# Patient Record
Sex: Female | Born: 1960 | Race: Black or African American | Hispanic: No | Smoking: Current every day smoker
Health system: Southern US, Community
[De-identification: ages and names within clinical notes are randomized; demographics above are authoritative.]

## PROBLEM LIST (undated history)

## (undated) DIAGNOSIS — I10 Essential (primary) hypertension: Secondary | ICD-10-CM

## (undated) DIAGNOSIS — C801 Malignant (primary) neoplasm, unspecified: Secondary | ICD-10-CM

## (undated) DIAGNOSIS — E7800 Pure hypercholesterolemia, unspecified: Secondary | ICD-10-CM

## (undated) HISTORY — PX: COLONOSCOPY: SHX174

---

## 2018-06-21 ENCOUNTER — Other Ambulatory Visit: Payer: Self-pay | Admitting: Neurological Surgery

## 2018-07-31 NOTE — Pre-Procedure Instructions (Signed)
Kathleen Villarreal  07/31/2018      [facility] [place], [place] - [place] [address] Phone: [phone] Fax: [phone]    Your procedure is scheduled on Monday December 16th.  Report to [facility] Admitting at 11:30 A.M.  Call this number if you have problems the morning of surgery:  [phone]   Remember:  Do not eat or drink after midnight.     Take these medicines the morning of surgery with A SIP OF WATER  amLODipine (NORVASC) gabapentin (NEURONTIN)-if needed  Hypromellose (ARTIFICIAL TEARS OP)-if needed  7 days prior to surgery STOP taking any Aspirin(unless otherwise instructed by your surgeon), Aleve, Naproxen, Ibuprofen, Motrin, Advil, Goody's, BC's, all herbal medications, fish oil, and all vitamins    Do not wear jewelry, make-up or nail polish.  Do not wear lotions, powders, or perfumes, or deodorant.  Do not shave 48 hours prior to surgery.    Do not bring valuables to the hospital.  [facility] is not responsible for any belongings or valuables.  Contacts, eyeglasses, hearing aids, dentures or bridgework may not be worn into surgery.  Leave your suitcase in the car.  After surgery it may be brought to your room.  For patients admitted to the hospital, discharge time will be determined by your treatment team.  Patients discharged the day of surgery will not be allowed to drive home.   [facility]- Preparing For Surgery  Before surgery, you can play an important role. Because skin is not sterile, your skin needs to be as free of germs as possible. You can reduce the number of germs on your skin by washing with CHG (chlorahexidine gluconate) Soap before surgery.  CHG is an antiseptic cleaner which kills germs and bonds with the skin to continue killing germs even after washing.    Oral Hygiene is also important to reduce your risk of infection.  Remember - BRUSH YOUR TEETH THE  MORNING OF SURGERY WITH YOUR REGULAR TOOTHPASTE  Please do not use if you have an allergy to CHG or antibacterial soaps. If your skin becomes reddened/irritated stop using the CHG.  Do not shave (including legs and underarms) for at least 48 hours prior to first CHG shower. It is OK to shave your face.  Please follow these instructions carefully.   1. Shower the NIGHT BEFORE SURGERY and the MORNING OF SURGERY with CHG.   2. If you chose to wash your hair, wash your hair first as usual with your normal shampoo.  3. After you shampoo, rinse your hair and body thoroughly to remove the shampoo.  4. Use CHG as you would any other liquid soap. You can apply CHG directly to the skin and wash gently with a scrungie or a clean washcloth.   5. Apply the CHG Soap to your body ONLY FROM THE NECK DOWN.  Do not use on open wounds or open sores. Avoid contact with your eyes, ears, mouth and genitals (private parts). Wash Face and genitals (private parts)  with your normal soap.  6. Wash thoroughly, paying special attention to the area where your surgery will be performed.  7. Thoroughly rinse your body with warm water from the neck down.  8. DO NOT shower/wash with your normal soap after using and rinsing off the CHG Soap.  9. Pat yourself dry with a CLEAN TOWEL.  10. Wear CLEAN PAJAMAS to bed the night before surgery, wear comfortable clothes  the morning of surgery  11. Place CLEAN SHEETS on your bed the night of your first shower and DO NOT SLEEP WITH PETS.    Day of Surgery: Shower as stated above. Do not apply any deodorants/lotions.  Please wear clean clothes to the hospital/surgery center.   Remember to brush your teeth WITH YOUR REGULAR TOOTHPASTE.

## 2018-08-01 ENCOUNTER — Encounter (HOSPITAL_COMMUNITY)
Admission: RE | Admit: 2018-08-01 | Discharge: 2018-08-01 | Disposition: A | Discharge status: Home / Self Care | Payer: Medicaid Other | Source: Ambulatory Visit | Attending: Neurological Surgery | Admitting: Neurological Surgery

## 2018-08-01 ENCOUNTER — Other Ambulatory Visit: Payer: Self-pay

## 2018-08-01 ENCOUNTER — Encounter (HOSPITAL_COMMUNITY): Payer: Self-pay

## 2018-08-01 DIAGNOSIS — Z01818 Encounter for other preprocedural examination: Secondary | ICD-10-CM | POA: Insufficient documentation

## 2018-08-01 HISTORY — DX: Pure hypercholesterolemia, unspecified: E78.00

## 2018-08-01 HISTORY — DX: Malignant (primary) neoplasm, unspecified: C80.1

## 2018-08-01 HISTORY — DX: Essential (primary) hypertension: I10

## 2018-08-01 LAB — CBC
HEMATOCRIT: 48.1 % — AB (ref 36.0–46.0)
Hemoglobin: 15.2 g/dL — ABNORMAL HIGH (ref 12.0–15.0)
MCH: 28.4 pg (ref 26.0–34.0)
MCHC: 31.6 g/dL (ref 30.0–36.0)
MCV: 89.9 fL (ref 80.0–100.0)
PLATELETS: 173 10*3/uL (ref 150–400)
RBC: 5.35 MIL/uL — ABNORMAL HIGH (ref 3.87–5.11)
RDW: 13.7 % (ref 11.5–15.5)
WBC: 8.6 10*3/uL (ref 4.0–10.5)
nRBC: 0 % (ref 0.0–0.2)

## 2018-08-01 LAB — BASIC METABOLIC PANEL
Anion gap: 11 (ref 5–15)
BUN: 16 mg/dL (ref 6–20)
CALCIUM: 9.6 mg/dL (ref 8.9–10.3)
CHLORIDE: 105 mmol/L (ref 98–111)
CO2: 23 mmol/L (ref 22–32)
CREATININE: 0.75 mg/dL (ref 0.44–1.00)
GFR calc Af Amer: >60 mL/min (ref >60)
GFR calc non Af Amer: >60 mL/min (ref >60)
Glucose, Bld: 85 mg/dL (ref 70–99)
Potassium: 4.4 mmol/L (ref 3.5–5.1)
Sodium: 139 mmol/L (ref 135–145)

## 2018-08-01 LAB — TYPE AND SCREEN
ABO/RH(D): A POS
Antibody Screen: NEGATIVE

## 2018-08-01 LAB — SURGICAL PCR SCREEN
MRSA, PCR: NEGATIVE
Staphylococcus aureus: NEGATIVE

## 2018-08-01 LAB — ABO/RH: ABO/RH(D): A POS

## 2018-08-01 NOTE — Progress Notes (Signed)
PCP - Oralia Rud, FNP-C  Cardiologist - denies  Chest x-ray - N/A EKG - 08/01/18 Stress Test - denies ECHO - denies Cardiac Cath - denies  Sleep Study - denies  Anesthesia review: No  Patient denies shortness of breath, fever, cough and chest pain at PAT appointment   Patient verbalized understanding of instructions that were given to them at the PAT appointment. Patient was also instructed that they will need to review over the PAT instructions again at home before surgery.

## 2018-08-07 ENCOUNTER — Inpatient Hospital Stay (HOSPITAL_COMMUNITY): Payer: Medicaid Other | Admitting: Anesthesiology

## 2018-08-07 ENCOUNTER — Inpatient Hospital Stay (HOSPITAL_COMMUNITY)
Admission: RE | Disposition: A | Discharge status: Home / Self Care | Payer: Self-pay | Source: Home / Self Care | Attending: Neurological Surgery

## 2018-08-07 ENCOUNTER — Inpatient Hospital Stay (HOSPITAL_COMMUNITY)
Admission: RE | Admit: 2018-08-07 | Discharge: 2018-08-11 | DRG: 454 | Disposition: A | Discharge status: Home / Self Care | Payer: Medicaid Other | Attending: Neurological Surgery | Admitting: Neurological Surgery

## 2018-08-07 ENCOUNTER — Inpatient Hospital Stay (HOSPITAL_COMMUNITY): Payer: Medicaid Other

## 2018-08-07 ENCOUNTER — Encounter (HOSPITAL_COMMUNITY): Payer: Self-pay | Admitting: Certified Registered Nurse Anesthetist

## 2018-08-07 DIAGNOSIS — Z8572 Personal history of non-Hodgkin lymphomas: Secondary | ICD-10-CM | POA: Diagnosis not present

## 2018-08-07 DIAGNOSIS — Z713 Dietary counseling and surveillance: Secondary | ICD-10-CM | POA: Diagnosis not present

## 2018-08-07 DIAGNOSIS — Z91041 Radiographic dye allergy status: Secondary | ICD-10-CM | POA: Diagnosis not present

## 2018-08-07 DIAGNOSIS — Z419 Encounter for procedure for purposes other than remedying health state, unspecified: Secondary | ICD-10-CM

## 2018-08-07 DIAGNOSIS — M4316 Spondylolisthesis, lumbar region: Principal | ICD-10-CM | POA: Diagnosis present

## 2018-08-07 DIAGNOSIS — I10 Essential (primary) hypertension: Secondary | ICD-10-CM | POA: Diagnosis present

## 2018-08-07 DIAGNOSIS — Z79899 Other long term (current) drug therapy: Secondary | ICD-10-CM

## 2018-08-07 DIAGNOSIS — M5416 Radiculopathy, lumbar region: Secondary | ICD-10-CM | POA: Diagnosis present

## 2018-08-07 DIAGNOSIS — M48062 Spinal stenosis, lumbar region with neurogenic claudication: Secondary | ICD-10-CM | POA: Diagnosis present

## 2018-08-07 DIAGNOSIS — Z880 Allergy status to penicillin: Secondary | ICD-10-CM

## 2018-08-07 DIAGNOSIS — F17210 Nicotine dependence, cigarettes, uncomplicated: Secondary | ICD-10-CM | POA: Diagnosis present

## 2018-08-07 DIAGNOSIS — D62 Acute posthemorrhagic anemia: Secondary | ICD-10-CM | POA: Diagnosis not present

## 2018-08-07 DIAGNOSIS — Z923 Personal history of irradiation: Secondary | ICD-10-CM

## 2018-08-07 SURGERY — POSTERIOR LUMBAR FUSION 1 LEVEL
Anesthesia: General | Site: Back

## 2018-08-07 MED ORDER — BUPIVACAINE HCL (PF) 0.5 % IJ SOLN
INTRAMUSCULAR | Status: DC | PRN
  Administered 2018-08-07: 10 mL

## 2018-08-07 MED ORDER — ROCURONIUM BROMIDE 50 MG/5ML IV SOSY
PREFILLED_SYRINGE | INTRAVENOUS | Status: AC
  Filled 2018-08-07: qty 5

## 2018-08-07 MED ORDER — SODIUM CHLORIDE 0.9 % IV SOLN: 250.0000 mL | INTRAVENOUS | Status: DC

## 2018-08-07 MED ORDER — BISACODYL 10 MG RE SUPP: 10.0000 mg | Freq: Every day | RECTAL | Status: DC | PRN

## 2018-08-07 MED ORDER — FLEET ENEMA 7-19 GM/118ML RE ENEM
1.0000 | ENEMA | Freq: Once | RECTAL | Status: AC | PRN
  Administered 2018-08-10: 1 via RECTAL
  Filled 2018-08-07: qty 1

## 2018-08-07 MED ORDER — KETOROLAC TROMETHAMINE 15 MG/ML IJ SOLN
15.0000 mg | Freq: Four times a day (QID) | INTRAMUSCULAR | Status: AC
  Administered 2018-08-07 – 2018-08-08 (×3): 15 mg via INTRAVENOUS
  Filled 2018-08-07 (×3): qty 1

## 2018-08-07 MED ORDER — METHOCARBAMOL 1000 MG/10ML IJ SOLN
500.0000 mg | Freq: Four times a day (QID) | INTRAVENOUS | Status: DC | PRN
  Filled 2018-08-07: qty 5

## 2018-08-07 MED ORDER — ALUM & MAG HYDROXIDE-SIMETH 200-200-20 MG/5ML PO SUSP: 30.0000 mL | Freq: Four times a day (QID) | ORAL | Status: DC | PRN

## 2018-08-07 MED ORDER — DEXAMETHASONE SODIUM PHOSPHATE 10 MG/ML IJ SOLN
INTRAMUSCULAR | Status: DC | PRN
  Administered 2018-08-07: 10 mg via INTRAVENOUS

## 2018-08-07 MED ORDER — DIPHENHYDRAMINE HCL 50 MG/ML IJ SOLN
INTRAMUSCULAR | Status: DC | PRN
  Administered 2018-08-07: 12.5 mg via INTRAVENOUS

## 2018-08-07 MED ORDER — PHENYLEPHRINE 40 MCG/ML (10ML) SYRINGE FOR IV PUSH (FOR BLOOD PRESSURE SUPPORT)
PREFILLED_SYRINGE | INTRAVENOUS | Status: DC | PRN
  Administered 2018-08-07: 80 ug via INTRAVENOUS

## 2018-08-07 MED ORDER — SODIUM CHLORIDE 0.9 % IV SOLN
INTRAVENOUS | Status: DC | PRN
  Administered 2018-08-07: 25 ug/min via INTRAVENOUS

## 2018-08-07 MED ORDER — ONDANSETRON HCL 4 MG/2ML IJ SOLN: 4.0000 mg | Freq: Four times a day (QID) | INTRAMUSCULAR | Status: DC | PRN

## 2018-08-07 MED ORDER — LISINOPRIL 20 MG PO TABS
40.0000 mg | ORAL_TABLET | Freq: Every day | ORAL | Status: DC
  Administered 2018-08-08 – 2018-08-11 (×2): 40 mg via ORAL
  Filled 2018-08-07 (×4): qty 2

## 2018-08-07 MED ORDER — ONDANSETRON HCL 4 MG/2ML IJ SOLN
INTRAMUSCULAR | Status: DC | PRN
  Administered 2018-08-07: 4 mg via INTRAVENOUS

## 2018-08-07 MED ORDER — ONDANSETRON HCL 4 MG PO TABS: 4.0000 mg | ORAL_TABLET | Freq: Four times a day (QID) | ORAL | Status: DC | PRN

## 2018-08-07 MED ORDER — OXYCODONE-ACETAMINOPHEN 5-325 MG PO TABS
1.0000 | ORAL_TABLET | ORAL | Status: DC | PRN
  Administered 2018-08-07 – 2018-08-11 (×19): 2 via ORAL
  Filled 2018-08-07 (×18): qty 2

## 2018-08-07 MED ORDER — ALBUMIN HUMAN 5 % IV SOLN
INTRAVENOUS | Status: DC | PRN
  Administered 2018-08-07: 17:00:00 via INTRAVENOUS

## 2018-08-07 MED ORDER — BUPIVACAINE HCL (PF) 0.5 % IJ SOLN
INTRAMUSCULAR | Status: AC
  Filled 2018-08-07: qty 30

## 2018-08-07 MED ORDER — LIDOCAINE 2% (20 MG/ML) 5 ML SYRINGE
INTRAMUSCULAR | Status: DC | PRN
  Administered 2018-08-07: 100 mg via INTRAVENOUS

## 2018-08-07 MED ORDER — LACTATED RINGERS IV SOLN: INTRAVENOUS | Status: DC

## 2018-08-07 MED ORDER — THROMBIN 20000 UNITS EX SOLR
CUTANEOUS | Status: DC | PRN
  Administered 2018-08-07: 16:00:00 via TOPICAL

## 2018-08-07 MED ORDER — ACETAMINOPHEN 325 MG PO TABS
650.0000 mg | ORAL_TABLET | ORAL | Status: DC | PRN
  Administered 2018-08-10: 650 mg via ORAL
  Filled 2018-08-07: qty 2

## 2018-08-07 MED ORDER — SUGAMMADEX SODIUM 200 MG/2ML IV SOLN
INTRAVENOUS | Status: DC | PRN
  Administered 2018-08-07: 200 mg via INTRAVENOUS

## 2018-08-07 MED ORDER — SODIUM CHLORIDE 0.9% FLUSH: 3.0000 mL | INTRAVENOUS | Status: DC | PRN

## 2018-08-07 MED ORDER — 0.9 % SODIUM CHLORIDE (POUR BTL) OPTIME
TOPICAL | Status: DC | PRN
  Administered 2018-08-07: 1000 mL

## 2018-08-07 MED ORDER — VANCOMYCIN HCL IN DEXTROSE 1-5 GM/200ML-% IV SOLN
1000.0000 mg | INTRAVENOUS | Status: AC
  Administered 2018-08-07: 1000 mg via INTRAVENOUS

## 2018-08-07 MED ORDER — HYPROMELLOSE (GONIOSCOPIC) 2.5 % OP SOLN
2.0000 [drp] | Freq: Every day | OPHTHALMIC | Status: DC | PRN
  Filled 2018-08-07: qty 15

## 2018-08-07 MED ORDER — DIPHENHYDRAMINE HCL 50 MG/ML IJ SOLN
INTRAMUSCULAR | Status: AC
  Filled 2018-08-07: qty 1

## 2018-08-07 MED ORDER — DOCUSATE SODIUM 100 MG PO CAPS
100.0000 mg | ORAL_CAPSULE | Freq: Two times a day (BID) | ORAL | Status: DC
  Administered 2018-08-07 – 2018-08-11 (×8): 100 mg via ORAL
  Filled 2018-08-07 (×8): qty 1

## 2018-08-07 MED ORDER — ACETAMINOPHEN 650 MG RE SUPP: 650.0000 mg | RECTAL | Status: DC | PRN

## 2018-08-07 MED ORDER — DEXAMETHASONE SODIUM PHOSPHATE 10 MG/ML IJ SOLN
INTRAMUSCULAR | Status: AC
  Filled 2018-08-07: qty 1

## 2018-08-07 MED ORDER — HYDROMORPHONE HCL 1 MG/ML IJ SOLN
INTRAMUSCULAR | Status: AC
  Administered 2018-08-07: 0.5 mg via INTRAVENOUS
  Filled 2018-08-07: qty 1

## 2018-08-07 MED ORDER — MORPHINE SULFATE (PF) 2 MG/ML IV SOLN
2.0000 mg | INTRAVENOUS | Status: DC | PRN
  Administered 2018-08-07 – 2018-08-09 (×2): 2 mg via INTRAVENOUS
  Filled 2018-08-07 (×3): qty 1

## 2018-08-07 MED ORDER — VANCOMYCIN HCL IN DEXTROSE 1-5 GM/200ML-% IV SOLN
INTRAVENOUS | Status: AC
  Filled 2018-08-07: qty 200

## 2018-08-07 MED ORDER — THROMBIN (RECOMBINANT) 20000 UNITS EX SOLR
CUTANEOUS | Status: AC
  Filled 2018-08-07: qty 20000

## 2018-08-07 MED ORDER — FENTANYL CITRATE (PF) 250 MCG/5ML IJ SOLN
INTRAMUSCULAR | Status: AC
  Filled 2018-08-07: qty 5

## 2018-08-07 MED ORDER — SODIUM CHLORIDE 0.9% FLUSH
3.0000 mL | Freq: Two times a day (BID) | INTRAVENOUS | Status: DC
  Administered 2018-08-08 – 2018-08-11 (×5): 3 mL via INTRAVENOUS

## 2018-08-07 MED ORDER — PHENOL 1.4 % MT LIQD: 1.0000 | OROMUCOSAL | Status: DC | PRN

## 2018-08-07 MED ORDER — THROMBIN 5000 UNITS EX SOLR
CUTANEOUS | Status: AC
  Filled 2018-08-07: qty 5000

## 2018-08-07 MED ORDER — OXYCODONE-ACETAMINOPHEN 5-325 MG PO TABS
ORAL_TABLET | ORAL | Status: AC
  Administered 2018-08-09: 2 via ORAL
  Filled 2018-08-07: qty 2

## 2018-08-07 MED ORDER — FENTANYL CITRATE (PF) 250 MCG/5ML IJ SOLN
INTRAMUSCULAR | Status: DC | PRN
  Administered 2018-08-07 (×2): 50 ug via INTRAVENOUS
  Administered 2018-08-07: 150 ug via INTRAVENOUS
  Administered 2018-08-07 (×5): 50 ug via INTRAVENOUS

## 2018-08-07 MED ORDER — MIDAZOLAM HCL 2 MG/2ML IJ SOLN
INTRAMUSCULAR | Status: DC | PRN
  Administered 2018-08-07: 2 mg via INTRAVENOUS

## 2018-08-07 MED ORDER — LACTATED RINGERS IV SOLN
INTRAVENOUS | Status: DC
  Administered 2018-08-07 (×2): via INTRAVENOUS

## 2018-08-07 MED ORDER — MIDAZOLAM HCL 2 MG/2ML IJ SOLN
INTRAMUSCULAR | Status: AC
  Filled 2018-08-07: qty 2

## 2018-08-07 MED ORDER — SENNA 8.6 MG PO TABS
1.0000 | ORAL_TABLET | Freq: Two times a day (BID) | ORAL | Status: DC
  Administered 2018-08-07 – 2018-08-11 (×8): 8.6 mg via ORAL
  Filled 2018-08-07 (×8): qty 1

## 2018-08-07 MED ORDER — AMLODIPINE BESYLATE 5 MG PO TABS
5.0000 mg | ORAL_TABLET | Freq: Every day | ORAL | Status: DC
  Administered 2018-08-08 – 2018-08-11 (×2): 5 mg via ORAL
  Filled 2018-08-07 (×4): qty 1

## 2018-08-07 MED ORDER — LIDOCAINE-EPINEPHRINE 1 %-1:100000 IJ SOLN
INTRAMUSCULAR | Status: AC
  Filled 2018-08-07: qty 1

## 2018-08-07 MED ORDER — SODIUM CHLORIDE 0.9 % IV SOLN
INTRAVENOUS | Status: DC | PRN
  Administered 2018-08-07: 16:00:00

## 2018-08-07 MED ORDER — CHLORHEXIDINE GLUCONATE CLOTH 2 % EX PADS: 6.0000 | MEDICATED_PAD | Freq: Once | CUTANEOUS | Status: DC

## 2018-08-07 MED ORDER — ONDANSETRON HCL 4 MG/2ML IJ SOLN
INTRAMUSCULAR | Status: AC
  Filled 2018-08-07: qty 2

## 2018-08-07 MED ORDER — ROCURONIUM BROMIDE 10 MG/ML (PF) SYRINGE
PREFILLED_SYRINGE | INTRAVENOUS | Status: DC | PRN
  Administered 2018-08-07 (×2): 50 mg via INTRAVENOUS
  Administered 2018-08-07: 30 mg via INTRAVENOUS

## 2018-08-07 MED ORDER — HYDROMORPHONE HCL 1 MG/ML IJ SOLN
0.2500 mg | INTRAMUSCULAR | Status: DC | PRN
  Administered 2018-08-07 (×2): 0.5 mg via INTRAVENOUS

## 2018-08-07 MED ORDER — GABAPENTIN 600 MG PO TABS
1200.0000 mg | ORAL_TABLET | Freq: Two times a day (BID) | ORAL | Status: DC | PRN
  Administered 2018-08-10: 1200 mg via ORAL
  Filled 2018-08-07: qty 2

## 2018-08-07 MED ORDER — MENTHOL 3 MG MT LOZG: 1.0000 | LOZENGE | OROMUCOSAL | Status: DC | PRN

## 2018-08-07 MED ORDER — LIDOCAINE 2% (20 MG/ML) 5 ML SYRINGE
INTRAMUSCULAR | Status: AC
  Filled 2018-08-07: qty 5

## 2018-08-07 MED ORDER — METHOCARBAMOL 500 MG PO TABS
ORAL_TABLET | ORAL | Status: AC
  Filled 2018-08-07: qty 1

## 2018-08-07 MED ORDER — SIMVASTATIN 20 MG PO TABS
40.0000 mg | ORAL_TABLET | Freq: Every evening | ORAL | Status: DC
  Administered 2018-08-08 – 2018-08-09 (×2): 40 mg via ORAL
  Filled 2018-08-07 (×2): qty 2

## 2018-08-07 MED ORDER — METHOCARBAMOL 500 MG PO TABS
500.0000 mg | ORAL_TABLET | Freq: Four times a day (QID) | ORAL | Status: DC | PRN
  Administered 2018-08-07 – 2018-08-11 (×6): 500 mg via ORAL
  Filled 2018-08-07 (×5): qty 1

## 2018-08-07 MED ORDER — PROPOFOL 10 MG/ML IV BOLUS
INTRAVENOUS | Status: DC | PRN
  Administered 2018-08-07: 170 mg via INTRAVENOUS

## 2018-08-07 MED ORDER — LIDOCAINE-EPINEPHRINE 1 %-1:100000 IJ SOLN
INTRAMUSCULAR | Status: DC | PRN
  Administered 2018-08-07: 10 mL

## 2018-08-07 MED ORDER — THROMBIN 5000 UNITS EX SOLR
OROMUCOSAL | Status: DC | PRN
  Administered 2018-08-07 (×2): via TOPICAL

## 2018-08-07 MED ORDER — PHENYLEPHRINE 40 MCG/ML (10ML) SYRINGE FOR IV PUSH (FOR BLOOD PRESSURE SUPPORT)
PREFILLED_SYRINGE | INTRAVENOUS | Status: AC
  Filled 2018-08-07: qty 10

## 2018-08-07 MED ORDER — POLYETHYLENE GLYCOL 3350 17 G PO PACK
17.0000 g | PACK | Freq: Every day | ORAL | Status: DC | PRN
  Administered 2018-08-10: 17 g via ORAL
  Filled 2018-08-07: qty 1

## 2018-08-07 SURGICAL SUPPLY — 63 items
BAG DECANTER FOR FLEXI CONT (MISCELLANEOUS) ×2 IMPLANT
BASKET BONE COLLECTION (BASKET) ×2 IMPLANT
BLADE CLIPPER SURG (BLADE) IMPLANT
BONE CANC CHIPS 20CC PCAN1/4 (Bone Implant) ×2 IMPLANT
BUR MATCHSTICK NEURO 3.0 LAGG (BURR) ×2 IMPLANT
CANISTER SUCT 3000ML PPV (MISCELLANEOUS) ×2 IMPLANT
CHIPS CANC BONE 20CC PCAN1/4 (Bone Implant) ×1 IMPLANT
CONT SPEC 4OZ CLIKSEAL STRL BL (MISCELLANEOUS) ×2 IMPLANT
COVER BACK TABLE 60X90IN (DRAPES) ×2 IMPLANT
COVER WAND RF STERILE (DRAPES) ×2 IMPLANT
DECANTER SPIKE VIAL GLASS SM (MISCELLANEOUS) ×2 IMPLANT
DERMABOND ADVANCED (GAUZE/BANDAGES/DRESSINGS) ×1
DERMABOND ADVANCED .7 DNX12 (GAUZE/BANDAGES/DRESSINGS) ×1 IMPLANT
DEVICE DISSECT PLASMABLAD 3.0S (MISCELLANEOUS) ×1 IMPLANT
DRAPE C-ARM 42X72 X-RAY (DRAPES) ×4 IMPLANT
DRAPE C-ARMOR (DRAPES) ×2 IMPLANT
DRAPE HALF SHEET 40X57 (DRAPES) IMPLANT
DRAPE LAPAROTOMY 100X72X124 (DRAPES) ×2 IMPLANT
DURAPREP 26ML APPLICATOR (WOUND CARE) ×2 IMPLANT
DURASEAL APPLICATOR TIP (TIP) IMPLANT
DURASEAL SPINE SEALANT 3ML (MISCELLANEOUS) IMPLANT
ELECT REM PT RETURN 9FT ADLT (ELECTROSURGICAL) ×2
ELECTRODE REM PT RTRN 9FT ADLT (ELECTROSURGICAL) ×1 IMPLANT
GAUZE 4X4 16PLY RFD (DISPOSABLE) ×2 IMPLANT
GAUZE SPONGE 4X4 12PLY STRL (GAUZE/BANDAGES/DRESSINGS) ×2 IMPLANT
GLOVE BIO SURGEON STRL SZ8 (GLOVE) ×2 IMPLANT
GLOVE BIOGEL PI IND STRL 8.5 (GLOVE) ×2 IMPLANT
GLOVE BIOGEL PI INDICATOR 8.5 (GLOVE) ×2
GLOVE ECLIPSE 8.5 STRL (GLOVE) ×4 IMPLANT
GLOVE INDICATOR 8.5 STRL (GLOVE) ×2 IMPLANT
GLOVE SURG SS PI 7.5 STRL IVOR (GLOVE) ×2 IMPLANT
GOWN STRL REUS W/ TWL LRG LVL3 (GOWN DISPOSABLE) IMPLANT
GOWN STRL REUS W/ TWL XL LVL3 (GOWN DISPOSABLE) IMPLANT
GOWN STRL REUS W/TWL 2XL LVL3 (GOWN DISPOSABLE) ×4 IMPLANT
GOWN STRL REUS W/TWL LRG LVL3 (GOWN DISPOSABLE)
GOWN STRL REUS W/TWL XL LVL3 (GOWN DISPOSABLE)
HEMOSTAT POWDER KIT SURGIFOAM (HEMOSTASIS) ×2 IMPLANT
KIT BASIN OR (CUSTOM PROCEDURE TRAY) ×2 IMPLANT
KIT TURNOVER KIT B (KITS) ×2 IMPLANT
MILL MEDIUM DISP (BLADE) ×2 IMPLANT
NEEDLE HYPO 22GX1.5 SAFETY (NEEDLE) ×2 IMPLANT
NS IRRIG 1000ML POUR BTL (IV SOLUTION) ×2 IMPLANT
PACK LAMINECTOMY NEURO (CUSTOM PROCEDURE TRAY) ×2 IMPLANT
PAD ARMBOARD 7.5X6 YLW CONV (MISCELLANEOUS) ×6 IMPLANT
PATTIES SURGICAL .5 X1 (DISPOSABLE) ×2 IMPLANT
PLASMABLADE 3.0S (MISCELLANEOUS) ×2
ROD TI ALLOY CVD VIT 5.5X35MM (Rod) ×4 IMPLANT
SCREW POLY TL 6.5X45 (Screw) ×8 IMPLANT
SPACER LORD STRT 8X20X10X8 (Spacer) ×4 IMPLANT
SPONGE LAP 4X18 RFD (DISPOSABLE) IMPLANT
SPONGE SURGIFOAM ABS GEL 100 (HEMOSTASIS) ×2 IMPLANT
SUT PROLENE 6 0 BV (SUTURE) IMPLANT
SUT VIC AB 1 CT1 18XBRD ANBCTR (SUTURE) ×1 IMPLANT
SUT VIC AB 1 CT1 8-18 (SUTURE) ×1
SUT VIC AB 2-0 CP2 18 (SUTURE) ×4 IMPLANT
SUT VIC AB 3-0 SH 8-18 (SUTURE) ×4 IMPLANT
SYR 3ML LL SCALE MARK (SYRINGE) ×8 IMPLANT
SYR CONTROL 10ML LL (SYRINGE) ×2 IMPLANT
TOP CLOSURE TORQ LIMIT (Neuro Prosthesis/Implant) ×8 IMPLANT
TOWEL GREEN STERILE (TOWEL DISPOSABLE) ×2 IMPLANT
TOWEL GREEN STERILE FF (TOWEL DISPOSABLE) ×2 IMPLANT
TRAY FOLEY MTR SLVR 16FR STAT (SET/KITS/TRAYS/PACK) ×2 IMPLANT
WATER STERILE IRR 1000ML POUR (IV SOLUTION) ×2 IMPLANT

## 2018-08-07 NOTE — Anesthesia Preprocedure Evaluation (Addendum)
Anesthesia Evaluation  Patient identified by MRN, date of birth, ID band  Reviewed: Allergy & Precautions, H&P , NPO status , Patient's Chart, lab work & pertinent test results  Airway Mallampati: II  TM Distance: >3 FB Neck ROM: Full    Dental no notable dental hx. (+) Partial Upper, Dental Advisory Given   Pulmonary Current Smoker,    Pulmonary exam normal breath sounds clear to auscultation       Cardiovascular hypertension, Pt. on medications  Rhythm:Regular Rate:Normal     Neuro/Psych negative neurological ROS  negative psych ROS   GI/Hepatic negative GI ROS, Neg liver ROS,   Endo/Other  negative endocrine ROS  Renal/GU negative Renal ROS  negative genitourinary   Musculoskeletal   Abdominal   Peds  Hematology negative hematology ROS (+)   Anesthesia Other Findings   Reproductive/Obstetrics negative OB ROS                            Anesthesia Physical Anesthesia Plan  ASA: II  Anesthesia Plan: General   Post-op Pain Management:    Induction: Intravenous  PONV Risk Score and Plan: 3 and Ondansetron, Dexamethasone and Midazolam  Airway Management Planned: Oral ETT  Additional Equipment:   Intra-op Plan:   Post-operative Plan: Extubation in OR  Informed Consent: I have reviewed the patients History and Physical, chart, labs and discussed the procedure including the risks, benefits and alternatives for the proposed anesthesia with the patient or authorized representative who has indicated his/her understanding and acceptance.   Dental advisory given  Plan Discussed with: CRNA  Anesthesia Plan Comments:         Anesthesia Quick Evaluation

## 2018-08-07 NOTE — Anesthesia Procedure Notes (Signed)
Procedure Name: Intubation Date/Time: 08/07/2018 2:40 PM Performed by: Alain Marion, CRNA Pre-anesthesia Checklist: Patient identified, Emergency Drugs available, Suction available and Patient being monitored Patient Re-evaluated:Patient Re-evaluated prior to induction Oxygen Delivery Method: Circle System Utilized Preoxygenation: Pre-oxygenation with 100% oxygen Induction Type: IV induction Ventilation: Mask ventilation without difficulty Laryngoscope Size: Miller and 2 Grade View: Grade I Tube type: Oral Number of attempts: 1 Airway Equipment and Method: Stylet and Oral airway Placement Confirmation: ETT inserted through vocal cords under direct vision,  positive ETCO2 and breath sounds checked- equal and bilateral Secured at: 21 cm Tube secured with: Tape Dental Injury: Teeth and Oropharynx as per pre-operative assessment

## 2018-08-07 NOTE — Op Note (Signed)
Date of surgery: 08/07/2018 Preoperative diagnosis: Lumbar spondylolisthesis L4-L5 with lumbar radiculopathy, neurogenic claudication. Postoperative diagnosis: Same Procedure: Bilateral laminectomies L4-L5 decompression of the L4 and L5 nerve roots with more work than required for simple interbody technique.  Posterior lumbar interbody arthrodesis using peek spacers local autograft and allograft L4-L5 pedicle screw fixation L4-L5 with posterior lateral arthrodesis using local autograft and allograft. Surgeon: Kristeen Miss First Assistant: Kary Kos MD Anesthesia: General endotracheal Indications: Allergy and Beatris Si is a 57 year old individual who is had significant back and bilateral lower extremity pain and weakness.  She has evidence of a spondylolisthesis with high-grade stenosis worse on the left than on the right for the L4-L5 level.  She is advised regarding the need for surgery to decompress the common dural tube and nerve roots and to stabilize her spine at L4-L5.  Procedure: Patient was brought to the operating room supine on a stretcher.  After the smooth induction of general tracheal anesthesia she was turned prone.  Back was prepped with alcohol and DuraPrep and draped in a sterile fashion.  Midline incision was created carried down to the lumbodorsal fascia which was opened on either side of midline for spinous processes identified with out of L4 and L5 with a radiograph.  Subperiosteal dissection was carried out to expose L4 and L5 out to the facet joints and over to the transverse processes.  These areas were isolated.  Then a large laminotomies were created removing the inferior margin lamina of L4 out to and including the entirety of the facet at L4-L5.  Thickened redundant yellow ligament with calcification within it was also removed and this allowed good decompression of the common dural tube and the L5 nerve roots inferiorly laminotomy and foraminotomy was created over the L5 nerve root.   Superiorly the L4 nerve root was cleared by dissecting away the thickened redundant yellow ligament out into the region of the foramen.  The disc was noted to be rolled over particularly on the left side and this was carefully taken down and the L4 nerve root was identified protected and decompressed.  Once completely decompressed the disc spaces were entered more fully and complete discectomy was carried out at L4-L5.  A series of sizers were then used to expand the interspace from initially a 6 mm to an 8 mm size.  Ultimately an 8 mm tall 8 degree lordotic Zai Stein peek spacer was filled with autograft and allograft and placed into the interspace along with 6 cc of autograft and allograft after the endplates were completely decorticated.  With the interspace being packed attention was turned to pedicle entry sites and lateral gutters were well decompressed and decorticated for packing with the remainder of the autograft and allograft 20 cc of allograft bone was mixed with the autograft that was harvested from the laminotomies.  6.5 x 45 mm pedicle screws were then placed in L4 and L5 sequentially with fluoroscopic guidance.  The lateral gutters were then packed with the remaining bone which totaled about 6 cc in each lateral gutter.  30 mm precontoured rods were used to connect the screws together.  This was done in the neutral construct.  And then final radiographs identified good distraction of the L4-L5 interspace the L4 and L5 nerve roots could each be palpated to make sure that they were manually decompressed hemostasis in the soft tissues was obtained meticulously and then the lumbodorsal fascia was reapproximated with #1 Vicryl interrupted fashion 2-0 Vicryl was used in subcutaneous tissues 3-0  Vicryl subcuticularly Dermabond was used on the skin blood loss is estimated 500 cc total.  No Cell Saver blood was recovered on the surgery Cell Saver was not used.

## 2018-08-07 NOTE — H&P (Signed)
CHIEF COMPLAINT: Pain in the spine and weakness in the legs for a year.  HISTORY OF PRESENT ILLNESS: Kathleen Villarreal is a 57 year old right-handed individual who tells me that, rather abruptly about a year ago, she started to develop pain on the left side of her back that radiated into primarily her left lower extremity. Over time, it has gotten considerably worse and she notes that she gets no relief of the pain despite having had treatment over the past year's time with 2 injections and some conservative visits to her primary care physician.  She initially had a CT scan that demonstrated that she had a spondylolisthesis in the lumbar spine and, subsequently, she underwent the injections.  They did not help whatsoever.  She notes that she then had an MRI of the lumbar spine performed at [facility] in [place] and this was done on July 24 of this year.  This demonstrates that she has a severe stenosis at L4-L5 with a grade 2 anterolisthesis of L4 on L5.  She has advanced degenerative changes at L5-S1, but the alignment of her spine and the patency of the central canal and lateral recesses is intact.  She has what appears to be a disc protrusion eccentric to the right side at L4-L5 along with the spondylolisthesis that accentuates the lateral recess stenosis.  Clinically, she feels that her legs are weak and, particularly, she feels that her left leg is going to give way when she walks any distance.  She has been dealing with the pain, but is referred now for further evaluation of this process.  PAST MEDICAL HISTORY: Reveals that she has high blood pressure and she is a cancer survivor of a lymphoma.  She notes that the lymphoma diagnosis with some 20 years ago and started as a little speck under her eye and was found to be lymphoma and required radiation treatments.  She has been cancer free since that time.  SOCIAL HISTORY: Other personal history reveals that she smokes about 4 cigarettes a day and she has done so  for a number of years.  CURRENT MEDICATIONS: Include Lisinopril, Simvastatin and Gabapentin twice a day that she uses for pain.  ALLERGIES: Penicillin and dye, which cause her to break out in hives.  SYSTEMS REVIEW: Notable for some night sweats, infections, high cholesterol, leg pain while walking, difficulty starting urinary stream, arthritis, leg weakness, back pain, anxiety and depression on a 14-point review sheet noted in the office today.  PHYSICAL EXAMINATION: I note that she does stand straight and erect.  She walks with a moderate antalgia involving the left lower extremity primarily, and she has weakness in the iliopsoas and quad on the right leg at 4/5 and some mild weakness in the quad on the left side at 4/5.  She has weakness in both tibialis anterior groups, graded at 4- out of 5 and she has difficulty walking onto her heels.  She can walk onto her toes with moderate ease.  She has absent deep tendon reflexes in the Achilles and the patellae, both.  Patrick maneuver is negative bilaterally.  IMPRESSION: The patient has advanced spondylolisthesis grade 2 at L4-L5 with severe stenosis and some moderate weakness in the distal lower extremities.  I have advised Kathleen Villarreal that, ultimately, she requires surgical intervention for this process.  This would require a laminectomy done posteriorly, followed by decompression of the L4-L5 disc space, placing some spacers to reduce the spondylolisthesis and pedicle screws to stabilize L4 and L5.  This is  a substantial undertaking, but is what is required in order to stabilize this process. Though she is not keen on surgery, she notes that she feels the need to do something definitive, as she feels her legs are becoming increasingly weaker and her tolerance to activity on her feet has been reduced substantially.  I strongly urged smoking cessation for her and she notes that this has been on her plan for a long period of time, but now would certainly be  a time to enact that.  I answered her questions regarding the nature of the surgery and the planned recovery, which typically requires about 8 weeks use of an external corset.  Most patients require some narcotic analgesia for several weeks postop, but generally are weaned away from that in a month's time and the fact that most patients can resume more normal activities after about 6-8 weeks from the surgery.  We will plan on scheduling the surgery at her convenience.

## 2018-08-07 NOTE — Transfer of Care (Signed)
Immediate Anesthesia Transfer of Care Note  Patient: Kathleen Villarreal  Procedure(s) Performed: Lumbar Four-Five Posterior lumbar interbody fusion (N/A Back)  Patient Location: PACU  Anesthesia Type:General  Level of Consciousness: awake  Airway & Oxygen Therapy: Patient Spontanous Breathing and Patient connected to face mask oxygen  Post-op Assessment: Report given to RN, Post -op Vital signs reviewed and stable and Patient moving all extremities X 4  Post vital signs: Reviewed and stable  Last Vitals:  Vitals Value Taken Time  BP 116/96 08/07/2018  6:02 PM  Temp 36.3 C 08/07/2018  6:01 PM  Pulse 63 08/07/2018  6:04 PM  Resp 27 08/07/2018  6:04 PM  SpO2 100 % 08/07/2018  6:04 PM  Vitals shown include unvalidated device data.  Last Pain:  Vitals:   08/07/18 1801  TempSrc:   PainSc: 7          Complications: No apparent anesthesia complications

## 2018-08-07 NOTE — Progress Notes (Signed)
Patient ID: Kathleen Villarreal, female   DOB: 02-Jul-1961, 57 y.o.   MRN: [id number] Vital signs are stable Appears to be moving legs well Appears reasonably comfortable postop

## 2018-08-08 ENCOUNTER — Other Ambulatory Visit: Payer: Self-pay

## 2018-08-08 LAB — CBC
HCT: 34.9 % — ABNORMAL LOW (ref 36.0–46.0)
Hemoglobin: 11 g/dL — ABNORMAL LOW (ref 12.0–15.0)
MCH: 28.3 pg (ref 26.0–34.0)
MCHC: 31.5 g/dL (ref 30.0–36.0)
MCV: 89.7 fL (ref 80.0–100.0)
PLATELETS: 141 10*3/uL — AB (ref 150–400)
RBC: 3.89 MIL/uL (ref 3.87–5.11)
RDW: 13.3 % (ref 11.5–15.5)
WBC: 12.1 10*3/uL — ABNORMAL HIGH (ref 4.0–10.5)
nRBC: 0 % (ref 0.0–0.2)

## 2018-08-08 LAB — BASIC METABOLIC PANEL
ANION GAP: 9 (ref 5–15)
BUN: 10 mg/dL (ref 6–20)
CALCIUM: 8.7 mg/dL — AB (ref 8.9–10.3)
CO2: 24 mmol/L (ref 22–32)
Chloride: 105 mmol/L (ref 98–111)
Creatinine, Ser: 0.7 mg/dL (ref 0.44–1.00)
GFR calc Af Amer: >60 mL/min (ref >60)
GLUCOSE: 138 mg/dL — AB (ref 70–99)
Potassium: 4 mmol/L (ref 3.5–5.1)
Sodium: 138 mmol/L (ref 135–145)

## 2018-08-08 MED FILL — Thrombin For Soln 5000 Unit: CUTANEOUS | Qty: 5000 | Status: AC

## 2018-08-08 MED FILL — Gelatin Absorbable MT Powder: OROMUCOSAL | Qty: 1 | Status: AC

## 2018-08-08 MED FILL — Thrombin (Recombinant) For Soln 20000 Unit: CUTANEOUS | Qty: 1 | Status: AC

## 2018-08-08 NOTE — Evaluation (Signed)
Physical Therapy Evaluation Patient Details Name: Kathleen Villarreal MRN: [id number] DOB: 1961/07/11 Today's Date: 08/08/2018   History of Present Illness  Pt is a 57yo female admitted for L4-L5 decompression. Pt with 1 yr ho back pain that has failed conservative treatment.  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   Clinical Impression  Pt admitted with/for elective lumbar fusion surgery. Pt presently needs min guard to supervision for basic mobility and gait.  Pt currently limited functionally due to the problems listed below.  (see problems list.)  Pt will benefit from PT to maximize function and safety to be able to get home safely with available assist.     Follow Up Recommendations Home health PT    Equipment Recommendations  None recommended by PT    Recommendations for Other Services       Precautions / Restrictions Precautions Precautions: Back Precaution Booklet Issued: Yes (comment) Precaution Comments: needs precautions reviewed. Required Braces or Orthoses: Spinal Brace Spinal Brace: Lumbar corset;Applied in sitting position Restrictions Weight Bearing Restrictions: No      Mobility  Bed Mobility Overal bed mobility: Needs Assistance Bed Mobility: Rolling;Sidelying to Sit Rolling: Supervision Sidelying to sit: Supervision       General bed mobility comments: cues given for log rolling and for hand placement. Bed was flat and pt did not use bedrails.  Transfers Overall transfer level: Needs assistance   Transfers: Sit to/from Stand;Stand Pivot Transfers Sit to Stand: Supervision Stand pivot transfers: Supervision       General transfer comment: Cues given for hand placement.  Ambulation/Gait Ambulation/Gait assistance: Supervision Gait Distance (Feet): 300 Feet Assistive device: Straight cane Gait Pattern/deviations: Step-through pattern   Gait velocity interpretation: >2.62 ft/sec, indicative of community ambulatory General Gait Details: safe use of the  SPC, generally steady and able to significantly increase speed to cue.  Stairs            Wheelchair Mobility    Modified Rankin (Stroke Patients Only)       Balance Overall balance assessment: Needs assistance Sitting-balance support: No upper extremity supported;Feet supported Sitting balance-Leahy Scale: Good     Standing balance support: During functional activity;Single extremity supported Standing balance-Leahy Scale: Fair Standing balance comment: Pt has used a cane for sometime now. Pt was steadier with cane.                             Pertinent Vitals/Pain Pain Assessment: Faces Pain Score: 6  Faces Pain Scale: Hurts little more Pain Location: back Pain Descriptors / Indicators: Aching;Operative site guarding Pain Intervention(s): Monitored during session;Repositioned;Premedicated before session    [place] expects to be discharged to:: Private residence Living Arrangements: Alone Available Help at Discharge: Family;Available PRN/intermittently Type of Home: House Home Access: Stairs to enter Entrance Stairs-Rails: None Entrance Stairs-Number of Steps: 1 Home Layout: One level Home Equipment: Cane - single point;Shower seat Additional Comments: Pt lives alone in a second story apartment she is moving out of.  Until then (Jan) she will stay with her son.  Her son works so she will be home alone at times but has somes support.    Prior Function Level of Independence: Independent with assistive device(s)         Comments: Walks with cane outside all the time and most times inside. Pt drives.     Hand Dominance   Dominant Hand: Right    Extremity/Trunk Assessment   Upper Extremity Assessment  Upper Extremity Assessment: Overall WFL for tasks assessed    Lower Extremity Assessment Lower Extremity Assessment: Overall WFL for tasks assessed    Cervical / Trunk Assessment Cervical / Trunk Assessment: Other  exceptions Cervical / Trunk Exceptions: current lumbar surgery  Communication   Communication: No difficulties  Cognition Arousal/Alertness: Awake/alert Behavior During Therapy: WFL for tasks assessed/performed Overall Cognitive Status: Within Functional Limits for tasks assessed                                        General Comments General comments (skin integrity, edema, etc.): Reinforced back care/prec, lifting restrictions, donning brace, progression of activity, demonstrated transition to/from sidelying.    Exercises     Assessment/Plan    PT Assessment Patient needs continued PT services  PT Problem List Decreased strength;Decreased activity tolerance;Decreased mobility;Decreased knowledge of use of DME;Decreased knowledge of precautions;Pain       PT Treatment Interventions Gait training;Stair training;Functional mobility training;Therapeutic activities;Patient/family education;DME instruction    PT Goals (Current goals can be found in the Care Plan section)  Acute Rehab PT Goals Patient Stated Goal: to not have so much pain. PT Goal Formulation: With patient Time For Goal Achievement: 08/15/18 Potential to Achieve Goals: Good    Frequency Min 5X/week   Barriers to discharge        Co-evaluation               AM-PAC PT "6 Clicks" Mobility  Outcome Measure Help needed turning from your back to your side while in a flat bed without using bedrails?: None Help needed moving from lying on your back to sitting on the side of a flat bed without using bedrails?: None Help needed moving to and from a bed to a chair (including a wheelchair)?: None Help needed standing up from a chair using your arms (e.g., wheelchair or bedside chair)?: None Help needed to walk in hospital room?: None Help needed climbing 3-5 steps with a railing? : A Little 6 Click Score: 23    End of Session Equipment Utilized During Treatment: Back brace Activity Tolerance:  Patient tolerated treatment well Patient left: in chair;with call bell/phone within reach Nurse Communication: Mobility status PT Visit Diagnosis: Other abnormalities of gait and mobility (R26.89);Pain Pain - part of body: (back)    Time: 4128-7867 PT Time Calculation (min) (ACUTE ONLY): 18 min   Charges:   PT Evaluation $PT Eval Low Complexity: 1 Low          08/08/2018  [name], PT Acute Rehabilitation Services [phone]  (pager) [phone]  (office)  Tessie Fass Maelys Kinnick 08/08/2018, 12:42 PM

## 2018-08-08 NOTE — Progress Notes (Signed)
2030 Pt arrived to unit from PACU. Pt A&Ox4, able to get up from stretcher and walk to bed. Surgical incision CDI, pt c/o pain. PACU RN stated patient was "just medicated" before arrival to unit. Patient oriented to room, assessed, skin checked with Lannette Donath, RN and vital signs taken. Pt asking for "food". RN provided patient snack, reviewed orders with patient and family at bedside. RN instructed use of incentive spirometer and patient performed correctly. Fall precautions in place, [facility]

## 2018-08-08 NOTE — Progress Notes (Signed)
Patient ID: Kathleen Villarreal, female   DOB: 08-26-1960, 57 y.o.   MRN: [id number] Vital signs are stable Patient has acute blood loss anemia with postoperative hemoglobin of 11.1 down from 15 She will not require transfusion as her vital signs remained stable She is ambulatory and feeling somewhat better We will continue to mobilize and see how independent she becomes in the next day or 2

## 2018-08-08 NOTE — Anesthesia Postprocedure Evaluation (Signed)
Anesthesia Post Note  Patient: Kathleen Villarreal  Procedure(s) Performed: Lumbar Four-Five Posterior lumbar interbody fusion (N/A Back)     Patient location during evaluation: PACU Anesthesia Type: General Level of consciousness: awake and alert Pain management: pain level controlled Vital Signs Assessment: post-procedure vital signs reviewed and stable Respiratory status: spontaneous breathing, nonlabored ventilation and respiratory function stable Cardiovascular status: blood pressure returned to baseline and stable Postop Assessment: no apparent nausea or vomiting Anesthetic complications: no    Last Vitals:  Vitals:   08/08/18 0239 08/08/18 0421  BP: 126/66 (!) 116/49  Pulse: 61 65  Resp: 18 18  Temp: 36.4 C 36.7 C  SpO2:  96%    Last Pain:  Vitals:   08/08/18 0551  TempSrc:   PainSc: 7                  Giuliana Handyside,W. EDMOND

## 2018-08-08 NOTE — Evaluation (Signed)
Occupational Therapy Evaluation Patient Details Name: Kathleen Villarreal MRN: [id number] DOB: Jun 09, 1961 Today's Date: 08/08/2018    History of Present Illness Pt is a 57yo female admitted for L4-L5 decompression. Pt with 1 yr ho back pain that has failed conservative treatment.  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    Clinical Impression   Pt admitted with the above diagnosis and has the deficits listed below. Pt would benefit from cont OT to increase independence with basic adls and adl transfers so she can eventually d/c home and live alone again.  Pt lives in second story apartment in which she is moving out of in January. Pt is all packed up so she will be living with her son for month of December to recover and then will move.  Pt feels she may need a HH Aid when she first gets to sons home as he works and will be gone part of the day. Pt is doing well and overall is min a to supervision with most adls and would not need an aid for long.  Reach and long sponge provided to improve independence with adls.  Since pt is overall supervision with occasional min guard for balance, no HHOT recommended at this time.      Follow Up Recommendations  Supervision - Intermittent;Other (comment)(HH Aid for short time while she is at sons and son at work?)    Equipment Recommendations  3 in 1 bedside commode    Recommendations for Other Services       Precautions / Restrictions Precautions Precautions: Back Precaution Booklet Issued: Yes (comment) Precaution Comments: needs precautions reviewed. Required Braces or Orthoses: Spinal Brace Spinal Brace: Lumbar corset;Applied in sitting position Restrictions Weight Bearing Restrictions: No      Mobility Bed Mobility Overal bed mobility: Needs Assistance Bed Mobility: Rolling;Sidelying to Sit Rolling: Supervision Sidelying to sit: Supervision       General bed mobility comments: cues given for log rolling and for hand placement. Bed was flat and  pt did not use bedrails.  Transfers Overall transfer level: Needs assistance   Transfers: Sit to/from Stand;Stand Pivot Transfers Sit to Stand: Min guard Stand pivot transfers: Supervision       General transfer comment: Cues given for hand placement.    Balance Overall balance assessment: Needs assistance Sitting-balance support: No upper extremity supported;Feet supported Sitting balance-Leahy Scale: Good     Standing balance support: During functional activity;Single extremity supported Standing balance-Leahy Scale: Fair Standing balance comment: Pt has used a cane for sometime now. Pt was steadier with cane.                           ADL either performed or assessed with clinical judgement   ADL Overall ADL's : Needs assistance/impaired Eating/Feeding: Independent;Sitting   Grooming: Wash/dry hands;Wash/dry face;Supervision/safety;Standing;Cueing for compensatory techniques Grooming Details (indicate cue type and reason): cues given to spit into cup and not drink by putting head under faucet to follow proper back precautions. Upper Body Bathing: Set up;Sitting   Lower Body Bathing: Minimal assistance;Sit to/from stand;Cueing for compensatory techniques Lower Body Bathing Details (indicate cue type and reason): cues to cross legs up if able to bathe feet. Pt can do this when legs are dry to dress but when legs are wet it is more difficult.  Pt provided with long handled sponge for bathing to easily reach LEs. Upper Body Dressing : Set up;Sitting Upper Body Dressing Details (indicate cue type and  reason): cues for brace since first time donning. Lower Body Dressing: Minimal assistance;Sit to/from stand;Cueing for compensatory techniques Lower Body Dressing Details (indicate cue type and reason): cues to cross legs to donn socks and shoes. Pt crossed legs easily. Toilet Transfer: Min guard;Comfort height toilet;Grab TEFL teacher Details (indicate cue type  and reason): Pt walked to bathroom with cane. Cues for hand placement. Toileting- Clothing Manipulation and Hygiene: Supervision/safety;Sit to/from stand;Cueing for compensatory techniques Toileting - Clothing Manipulation Details (indicate cue type and reason): cues provided to avoid twisting.   Tub/Shower Transfer Details (indicate cue type and reason): discussed shower transfer with use of 3:1. Pt has  shower chair but feets 3:1 is more sturdy and needs one for over her toilet. She will bathe only when son is home to start. Functional mobility during ADLs: Min guard;Cane General ADL Comments: Pt doing well with adls. Adaptive equipment (reacher) provided to assist with following back precautions.     Vision Baseline Vision/History: No visual deficits Patient Visual Report: No change from baseline Vision Assessment?: No apparent visual deficits     Perception Perception Perception Tested?: No   Praxis Praxis Praxis tested?: Not tested    Pertinent Vitals/Pain Pain Assessment: 0-10 Pain Score: 6  Pain Location: back Pain Descriptors / Indicators: Aching;Operative site guarding Pain Intervention(s): Limited activity within patient's tolerance;Premedicated before session;Monitored during session;Repositioned     Hand Dominance Right   Extremity/Trunk Assessment Upper Extremity Assessment Upper Extremity Assessment: Overall WFL for tasks assessed   Lower Extremity Assessment Lower Extremity Assessment: Defer to PT evaluation   Cervical / Trunk Assessment Cervical / Trunk Assessment: Other exceptions Cervical / Trunk Exceptions: current lumbar surgery   Communication Communication Communication: No difficulties   Cognition Arousal/Alertness: Awake/alert Behavior During Therapy: WFL for tasks assessed/performed Overall Cognitive Status: Within Functional Limits for tasks assessed                                     General Comments  Incision was closed and  not covered.     Exercises     Shoulder Instructions      Home Living Family/patient expects to be discharged to:: Private residence Living Arrangements: Alone Available Help at Discharge: Family;Available PRN/intermittently Type of Home: House Home Access: Stairs to enter [employer] of Steps: 1 Entrance Stairs-Rails: None Home Layout: One level     Bathroom Shower/Tub: Tub/shower unit;Curtain   Biochemist, clinical: Standard     Home Equipment: Cane - single point;Shower seat   Additional Comments: Pt lives alone in a second story apartment she is moving out of.  Until then (Jan) she will stay with her son.  Her son works so she will be home alone at times but has somes support.      Prior Functioning/Environment Level of Independence: Independent with assistive device(s)        Comments: Walks with cane outside all the time and most times inside. Pt drives.        OT Problem List: Impaired balance (sitting and/or standing);Decreased knowledge of use of DME or AE;Decreased knowledge of precautions;Pain      OT Treatment/Interventions: Self-care/ADL training;DME and/or AE instruction;Therapeutic activities    OT Goals(Current goals can be found in the care plan section) Acute Rehab OT Goals Patient Stated Goal: to not have so much pain. OT Goal Formulation: With patient Time For Goal Achievement: 08/22/18 Potential to Achieve Goals: Good ADL  Goals Pt Will Perform Tub/Shower Transfer: Tub transfer;with supervision;3 in 1;ambulating Additional ADL Goal #1: Pt will walk to bathroom and complete all toileting activites with supervision and use of cane.  OT Frequency: Min 2X/week   Barriers to D/C: Decreased caregiver support  will stay with son but he does work       Co-evaluation              AM-PAC OT "6 Clicks" Daily Activity     Outcome Measure Help from another person eating meals?: None Help from another person taking care of personal  grooming?: None Help from another person toileting, which includes using toliet, bedpan, or urinal?: A Little Help from another person bathing (including washing, rinsing, drying)?: A Little Help from another person to put on and taking off regular upper body clothing?: None Help from another person to put on and taking off regular lower body clothing?: A Little 6 Click Score: 21   End of Session Equipment Utilized During Treatment: Back brace;Other (comment)(cane) Nurse Communication: Mobility status  Activity Tolerance: Patient tolerated treatment well Patient left: in chair;with call bell/phone within reach  OT Visit Diagnosis: Unsteadiness on feet (R26.81);History of falling (Z91.81);Pain Pain - part of body: (back)                Time: 0950-1030 OT Time Calculation (min): 40 min Charges:  OT General Charges $OT Visit: 1 Visit OT Evaluation $OT Eval Moderate Complexity: 1 Mod OT Treatments $Self Care/Home Management : 8-22 mins  Jinger Neighbors, OTR/L [phone]  Glenford Peers 08/08/2018, 10:45 AM

## 2018-08-08 NOTE — Progress Notes (Signed)
Orthopedic Tech Progress Note Patient Details:  Kathleen Villarreal 13-Apr-1961 [id number]  Patient ID: Brown Human, female   DOB: 10-Feb-1961, 57 y.o.   MRN: [id number] RN says pt already has brace.  [name] 08/08/2018, 9:29 AM

## 2018-08-09 MED ORDER — MAGNESIUM HYDROXIDE 400 MG/5ML PO SUSP
30.0000 mL | Freq: Every day | ORAL | Status: DC | PRN
  Administered 2018-08-09 (×2): 30 mL via ORAL
  Filled 2018-08-09 (×2): qty 30

## 2018-08-09 NOTE — Progress Notes (Signed)
Patient ID: Kathleen Villarreal, female   DOB: [birth year], 57 y.o.   MRN: [id number] Vital signs are stable Patient notes that back hurts more today than it did yesterday She is voiding well but has not passed any gas We will use some milk of magnesia today and possibly suppository if necessary Motor function appears to be doing well otherwise Stable

## 2018-08-09 NOTE — Progress Notes (Signed)
Occupational Therapy Treatment Patient Details Name: Kathleen Villarreal MRN: [id number] DOB: 08/11/1961 Today's Date: 08/09/2018    History of present illness Pt is a 57yo female admitted for L4-L5 decompression. Pt with 1 yr ho back pain that has failed conservative treatment.  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    OT comments  Pt is at adequate level for d/c from OT stand point at this time. Pt with handouts for 3n1 bathroom setup and reacher/sponge issued to the patient on evaluation. Pt without questions at this time.   Follow Up Recommendations  Supervision - Intermittent    Equipment Recommendations  3 in 1 bedside commode    Recommendations for Other Services      Precautions / Restrictions Precautions Precautions: Back Required Braces or Orthoses: Spinal Brace Spinal Brace: Lumbar corset;Applied in sitting position       Mobility Bed Mobility Overal bed mobility: Modified Independent             General bed mobility comments: exiting on the right side  Transfers Overall transfer level: Modified independent               General transfer comment: pt alternating the hand use for the cane    Balance                                           ADL either performed or assessed with clinical judgement   ADL Overall ADL's : Needs assistance/impaired                 Upper Body Dressing : Modified independent;Sitting Upper Body Dressing Details (indicate cue type and reason): able to don brace. pt needed incr time to attached the pull strings     Toilet Transfer: Modified Independent       Tub/ Shower Transfer: Supervision/safety;3 in 1 Tub/Shower Transfer Details (indicate cue type and reason): hand out provided and education complete. pt will have son help her place 3n1 Functional mobility during ADLs: Supervision/safety General ADL Comments: communication to the RN was made on the patients behalf. the patient was upset about the  time between call bell used and the arrival of staff to help with the bathroom     Vision       Perception     Praxis      Cognition Arousal/Alertness: Awake/alert Behavior During Therapy: [facility] for tasks assessed/performed Overall Cognitive Status: Within Functional Limits for tasks assessed                                          Exercises     Shoulder Instructions       General Comments      Pertinent Vitals/ Pain          Home Living                                          Prior Functioning/Environment              Frequency  Min 2X/week        Progress Toward Goals  OT Goals(current goals can now be found in the care plan section)  Progress towards  OT goals: Progressing toward goals  Acute Rehab OT Goals Patient Stated Goal: to find out why it takes so long to get some help OT Goal Formulation: With patient Time For Goal Achievement: 08/22/18 Potential to Achieve Goals: Good ADL Goals Pt Will Perform Tub/Shower Transfer: Tub transfer;with supervision;3 in 1;ambulating Additional ADL Goal #1: Pt will walk to bathroom and complete all toileting activites with supervision and use of cane.  Plan Discharge plan remains appropriate    Co-evaluation                 AM-PAC OT "6 Clicks" Daily Activity     Outcome Measure   Help from another person eating meals?: None Help from another person taking care of personal grooming?: None Help from another person toileting, which includes using toliet, bedpan, or urinal?: A Little Help from another person bathing (including washing, rinsing, drying)?: A Little Help from another person to put on and taking off regular upper body clothing?: None Help from another person to put on and taking off regular lower body clothing?: A Little 6 Click Score: 21    End of Session Equipment Utilized During Treatment: Back brace;Other (comment)(cane)  OT Visit Diagnosis:  Unsteadiness on feet (R26.81);History of falling (Z91.81);Pain   Activity Tolerance Patient tolerated treatment well   Patient Left in chair;with call bell/phone within reach   Nurse Communication Mobility status;Precautions        Time: 1358(1358)-1417 OT Time Calculation (min): 19 min  Charges: OT General Charges $OT Visit: 1 Visit OT Treatments $Self Care/Home Management : 8-22 mins   Jeri Modena, OTR/L  Acute Rehabilitation Services Pager: [phone] Office: [phone] .    Jeri Modena 08/09/2018, 3:34 PM

## 2018-08-09 NOTE — Progress Notes (Signed)
Physical Therapy Treatment Patient Details Name: Kathleen Villarreal MRN: [id number] DOB: 03/07/1961 Today's Date: 08/09/2018    History of Present Illness Pt is a 57yo female admitted for L4-L5 decompression. Pt with 1 yr ho back pain that has failed conservative treatment.  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     PT Comments    Has demonstrated understanding of back safety and safe transitions.  Steady gait with the SPC.   Follow Up Recommendations  Home health PT     Equipment Recommendations  None recommended by PT    Recommendations for Other Services       Precautions / Restrictions Precautions Precautions: Back Precaution Booklet Issued: Yes (comment) Precaution Comments: needs precautions reviewed. Required Braces or Orthoses: Spinal Brace Spinal Brace: Lumbar corset;Applied in sitting position Restrictions Weight Bearing Restrictions: No    Mobility  Bed Mobility Overal bed mobility: Needs Assistance Bed Mobility: Rolling;Sidelying to Sit;Sit to Sidelying Rolling: Supervision Sidelying to sit: Supervision     Sit to sidelying: Supervision General bed mobility comments: practiced safe technique x3 without rail or assist  Transfers Overall transfer level: Needs assistance   Transfers: Sit to/from Stand;Stand Pivot Transfers Sit to Stand: Supervision Stand pivot transfers: Supervision       General transfer comment: safe hand placement  Ambulation/Gait Ambulation/Gait assistance: Supervision Gait Distance (Feet): 300 Feet Assistive device: Straight cane Gait Pattern/deviations: Step-through pattern   Gait velocity interpretation: >2.62 ft/sec, indicative of community ambulatory General Gait Details: safe, steady and able to change speeds significantly   Stairs Stairs: Yes Stairs assistance: Modified independent (Device/Increase time) Stair Management: One rail Right;Step to pattern;Forwards Number of Stairs: 12 General stair comments: safe with  rail   Wheelchair Mobility    Modified Rankin (Stroke Patients Only)       Balance     Sitting balance-Leahy Scale: Good       Standing balance-Leahy Scale: Good Standing balance comment: prefers the cane, but steady with static and some dynamic tasks                            Cognition Arousal/Alertness: Awake/alert Behavior During Therapy: WFL for tasks assessed/performed Overall Cognitive Status: Within Functional Limits for tasks assessed                                        Exercises      General Comments General comments (skin integrity, edema, etc.): Reinforced all back care.       Pertinent Vitals/Pain Pain Assessment: 0-10 Pain Score: 8  Pain Location: back Pain Descriptors / Indicators: Aching;Operative site guarding Pain Intervention(s): Monitored during session    Home Living                      Prior Function            PT Goals (current goals can now be found in the care plan section) Acute Rehab PT Goals Patient Stated Goal: to not have so much pain. PT Goal Formulation: With patient Time For Goal Achievement: 08/15/18 Potential to Achieve Goals: Good Progress towards PT goals: Progressing toward goals    Frequency    Min 5X/week      PT Plan Current plan remains appropriate    Co-evaluation              AM-PAC  PT "6 Clicks" Mobility   Outcome Measure  Help needed turning from your back to your side while in a flat bed without using bedrails?: None Help needed moving from lying on your back to sitting on the side of a flat bed without using bedrails?: None Help needed moving to and from a bed to a chair (including a wheelchair)?: None Help needed standing up from a chair using your arms (e.g., wheelchair or bedside chair)?: None Help needed to walk in hospital room?: None Help needed climbing 3-5 steps with a railing? : A Little 6 Click Score: 23    End of Session Equipment  Utilized During Treatment: Back brace Activity Tolerance: Patient tolerated treatment well Patient left: with call bell/phone within reach;in bed Nurse Communication: Mobility status PT Visit Diagnosis: Other abnormalities of gait and mobility (R26.89);Pain Pain - part of body: (back)     Time: 3846-6599 PT Time Calculation (min) (ACUTE ONLY): 24 min  Charges:  $Gait Training: 8-22 mins $Therapeutic Activity: 8-22 mins                     08/09/2018  [name], PT Acute Rehabilitation Services [phone]  (pager) [phone]  (office)   Tessie Fass Flavius Repsher 08/09/2018, 10:43 AM

## 2018-08-10 NOTE — Progress Notes (Signed)
Physical Therapy Treatment Patient Details Name: Kathleen Villarreal MRN: [id number] DOB: 1960/11/27 Today's Date: 08/10/2018    History of Present Illness Pt is a 57yo female admitted for L4-L5 decompression. Pt with 1 yr ho back pain that has failed conservative treatment.  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     PT Comments    Improving daily.  Emphasis on bed mobility technique, gait stability and speed.    Follow Up Recommendations  Home health PT     Equipment Recommendations  Other (comment)(Quad cane)    Recommendations for Other Services       Precautions / Restrictions Precautions Precautions: Back Required Braces or Orthoses: Spinal Brace Spinal Brace: Lumbar corset;Applied in sitting position    Mobility  Bed Mobility Overal bed mobility: Modified Independent             General bed mobility comments: reinforced improved technique  Transfers Overall transfer level: Modified independent                  Ambulation/Gait Ambulation/Gait assistance: Supervision Gait Distance (Feet): -25 Feet Assistive device: Straight cane Gait Pattern/deviations: Step-through pattern   Gait velocity interpretation: >2.62 ft/sec, indicative of community ambulatory General Gait Details: steady overall, ability to change speeds.   Stairs             Wheelchair Mobility    Modified Rankin (Stroke Patients Only)       Balance     Sitting balance-Leahy Scale: Good       Standing balance-Leahy Scale: Good                              Cognition Arousal/Alertness: Awake/alert Behavior During Therapy: WFL for tasks assessed/performed Overall Cognitive Status: Within Functional Limits for tasks assessed                                        Exercises      General Comments General comments (skin integrity, edema, etc.): Reinforced all back education      Pertinent Vitals/Pain Pain Assessment: Faces Faces Pain  Scale: Hurts even more Pain Location: back Pain Descriptors / Indicators: Aching;Operative site guarding Pain Intervention(s): Monitored during session    Home Living                      Prior Function            PT Goals (current goals can now be found in the care plan section) Acute Rehab PT Goals Patient Stated Goal: to find out why it takes so long to get some help PT Goal Formulation: With patient Time For Goal Achievement: 08/15/18 Potential to Achieve Goals: Good Progress towards PT goals: Progressing toward goals    Frequency    Min 5X/week      PT Plan Current plan remains appropriate    Co-evaluation              AM-PAC PT "6 Clicks" Mobility   Outcome Measure  Help needed turning from your back to your side while in a flat bed without using bedrails?: None Help needed moving from lying on your back to sitting on the side of a flat bed without using bedrails?: None Help needed moving to and from a bed to a chair (including a wheelchair)?: None Help needed standing  up from a chair using your arms (e.g., wheelchair or bedside chair)?: None Help needed to walk in hospital room?: None Help needed climbing 3-5 steps with a railing? : A Little 6 Click Score: 23    End of Session Equipment Utilized During Treatment: Back brace Activity Tolerance: Patient tolerated treatment well Patient left: with call bell/phone within reach;in bed Nurse Communication: Mobility status PT Visit Diagnosis: Other abnormalities of gait and mobility (R26.89);Pain Pain - part of body: (back)     Time: 1610-9604 PT Time Calculation (min) (ACUTE ONLY): 20 min  Charges:  $Gait Training: 8-22 mins                     08/10/2018  Kathleen Villarreal, PT Acute Rehabilitation Services [phone]  (pager) [phone]  (office)   Kathleen Villarreal 08/10/2018, 3:24 PM

## 2018-08-10 NOTE — Progress Notes (Signed)
Patient ID: Kathleen Villarreal, female   DOB: 1961/01/27, 57 y.o.   MRN: [id number] Signs are stable Patient still having significant back pain buttock pain We will stop simvastatin for the current time Mobilizing well but patient does not have anyone at home who can help her today Her son starts a 2-week hiatus during which time he will be available to help her starting tomorrow afternoon.  She wishes to stay until tomorrow afternoon.

## 2018-08-11 MED ORDER — METHOCARBAMOL 500 MG PO TABS: 500.0000 mg | ORAL_TABLET | Freq: Four times a day (QID) | ORAL | 3 refills | Status: AC | PRN

## 2018-08-11 MED ORDER — OXYCODONE-ACETAMINOPHEN 5-325 MG PO TABS: 1.0000 | ORAL_TABLET | ORAL | 0 refills | Status: AC | PRN

## 2018-08-11 MED ORDER — BISACODYL 10 MG RE SUPP: 10.0000 mg | Freq: Every day | RECTAL | 0 refills | Status: AC | PRN

## 2018-08-11 NOTE — Plan of Care (Signed)
Adequate for discharge.

## 2018-08-11 NOTE — Discharge Summary (Signed)
Physician Discharge Summary  Patient ID: Kathleen Villarreal MRN: [id number] DOB/AGE: 59-Jul-1962 59 y.o.  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  She tolerated surgery well however patient does not live independently and she would have no one at her household to monitor her until this Friday the day of discharge at which point her son will be available to help her with her activities of daily living.  Consults: None  Significant Diagnostic Studies: None  Treatments: surgery: Lumbar laminectomy L4-L5 decompression of L4 and L5 nerve roots posterior lumbar interbody arthrodesis with posterior lateral arthrodesis L4-L5  Discharge Exam: Blood pressure 131/68, pulse 93, temperature 98.1 F (36.7 C), temperature source Oral, resp. rate 18, height 5\' 8"  (1.727 m), weight 101.3 kg, SpO2 100 %. Incision is clean and dry and motor function is intact patient and gait is intact  Disposition: Discharge disposition: 01-Home or Self Care       Discharge Instructions    Call MD for:  redness, tenderness, or signs of infection (pain, swelling, redness, odor or green/yellow discharge around incision site)   Complete by:  As directed    Call MD for:  severe uncontrolled pain   Complete by:  As directed    Call MD for:  temperature >100.4   Complete by:  As directed    Diet - low sodium heart healthy   Complete by:  As directed    Discharge instructions   Complete by:  As directed    Okay to shower. Do not apply salves or ointments to incision. No heavy lifting with the upper extremities  greater than 15 pounds. May resume driving when not requiring pain medication and patient feels comfortable with doing so.   Incentive spirometry RT   Complete by:  As directed    Increase activity slowly   Complete by:  As directed      Allergies as of 08/11/2018      Reactions   Aspirin Other (See Comments)   Nose bleed   Ivp Dye [iodinated Diagnostic Agents] Hives   Penicillins Other (See Comments)   Unknown Has patient had a PCN reaction causing immediate rash, facial/tongue/throat swelling, SOB or lightheadedness with hypotension: Unknown Has patient had a PCN reaction causing severe rash involving mucus membranes or skin necrosis: Unknown Has patient had a PCN reaction that required hospitalization: Unknown Has patient had a PCN reaction occurring within the last 10 years: No If all of the above answers are "NO", then may proceed with Cephalosporin use.      Medication List    TAKE these medications   amLODipine 5 MG tablet Commonly known as:  NORVASC Take 5 mg by mouth daily.   ARTIFICIAL TEARS OP Place 2 drops into both eyes daily as needed (for irritation).   bisacodyl 10 MG suppository Commonly known as:  DULCOLAX Place 1 suppository (10 mg total) rectally daily as needed for moderate constipation.   gabapentin 600 MG tablet Commonly known as:  NEURONTIN Take 1,200 mg by mouth 2 (two) times daily as needed (for pain).   lisinopril 40 MG tablet Commonly known as:  PRINIVIL,ZESTRIL Take 40 mg by mouth daily.   methocarbamol 500 MG tablet Commonly known  as:  ROBAXIN Take 1 tablet (500 mg total) by mouth every 6 (six) hours as needed for muscle spasms.   MULTIVITAMIN PO Take 1 tablet by mouth 4 (four) times a week.   oxyCODONE-acetaminophen 5-325 MG tablet Commonly known as:  PERCOCET/ROXICET Take 1-2 tablets by mouth every 3 (three) hours as needed for moderate pain or severe pain.   simvastatin 40 MG tablet Commonly known as:  ZOCOR Take 40 mg by mouth  every evening.            Durable Medical Equipment  (From admission, onward)         Start     Ordered   08/09/18 1227  For home use only DME 3 n 1  Once     08/09/18 1227   08/09/18 1227  For home use only DME Cane  Once    Comments:  Quad cane   08/09/18 1227           Signed: Earleen Newport 08/11/2018, 6:40 PM

## 2018-08-11 NOTE — Progress Notes (Signed)
Physical Therapy Treatment Patient Details Name: Kathleen Villarreal MRN: [id number] DOB: 02-25-1961 Today's Date: 08/11/2018    History of Present Illness Pt is a 57yo female admitted for L4-L5 decompression. Pt with 1 yr ho back pain that has failed conservative treatment.  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     PT Comments    Emphasis on education and progression of activity.  Pt is modified independent overall and ready for d/c.  Will sign off as goals met.   Follow Up Recommendations  Home health PT     Equipment Recommendations       Recommendations for Other Services       Precautions / Restrictions Precautions Precautions: Back Precaution Booklet Issued: Yes (comment) Precaution Comments: needs precautions reviewed. Required Braces or Orthoses: Spinal Brace Spinal Brace: Lumbar corset;Applied in sitting position    Mobility  Bed Mobility Overal bed mobility: Modified Independent             General bed mobility comments: reinforced improved technique  Transfers Overall transfer level: Modified independent                  Ambulation/Gait Ambulation/Gait assistance: Modified independent (Device/Increase time) Gait Distance (Feet): 20 Feet Assistive device: Straight cane Gait Pattern/deviations: Step-through pattern   Gait velocity interpretation: 1.31 - 2.62 ft/sec, indicative of limited community ambulator General Gait Details: steady overall, stiff   Stairs             Wheelchair Mobility    Modified Rankin (Stroke Patients Only)       Balance     Sitting balance-Leahy Scale: Good       Standing balance-Leahy Scale: Good                              Cognition Arousal/Alertness: Awake/alert Behavior During Therapy: WFL for tasks assessed/performed Overall Cognitive Status: Within Functional Limits for tasks assessed                                        Exercises      General Comments  General comments (skin integrity, edema, etc.): Reinforced progression of activity      Pertinent Vitals/Pain Pain Assessment: Faces Faces Pain Scale: Hurts even more Pain Location: back Pain Descriptors / Indicators: Aching;Guarding Pain Intervention(s): Monitored during session    Home Living                      Prior Function            PT Goals (current goals can now be found in the care plan section) Acute Rehab PT Goals Patient Stated Goal: To feel good. PT Goal Formulation: With patient Time For Goal Achievement: 08/15/18 Potential to Achieve Goals: Good Progress towards PT goals: Progressing toward goals    Frequency    Min 5X/week      PT Plan Current plan remains appropriate    Co-evaluation              AM-PAC PT "6 Clicks" Mobility   Outcome Measure  Help needed turning from your back to your side while in a flat bed without using bedrails?: None Help needed moving from lying on your back to sitting on the side of a flat bed without using bedrails?: None Help needed moving to and from  a bed to a chair (including a wheelchair)?: None Help needed standing up from a chair using your arms (e.g., wheelchair or bedside chair)?: None Help needed to walk in hospital room?: None Help needed climbing 3-5 steps with a railing? : A Little 6 Click Score: 23    End of Session Equipment Utilized During Treatment: Back brace Activity Tolerance: Patient tolerated treatment well Patient left: with call bell/phone within reach;in bed Nurse Communication: Mobility status PT Visit Diagnosis: Other abnormalities of gait and mobility (R26.89);Pain Pain - part of body: (back)     Time: 0941-1000 PT Time Calculation (min) (ACUTE ONLY): 19 min  Charges:  $Self Care/Home Management: 8-22                     08/11/2018  [name], PT Acute Rehabilitation Services [phone]  (pager) [phone]  (office)   Tessie Fass Ragen Laver 08/11/2018,  10:13 AM

## 2019-12-15 IMAGING — CR DG LUMBAR SPINE 2-3V
1 series · 1 of 1 positions shown · non-contrast
Comparison: 03/15/2018 outside MR.

CLINICAL DATA: 57-year-old female for L4-5 fusion. Initial
encounter.

EXAM:
LUMBAR SPINE - 2-3 VIEW

[lateral]
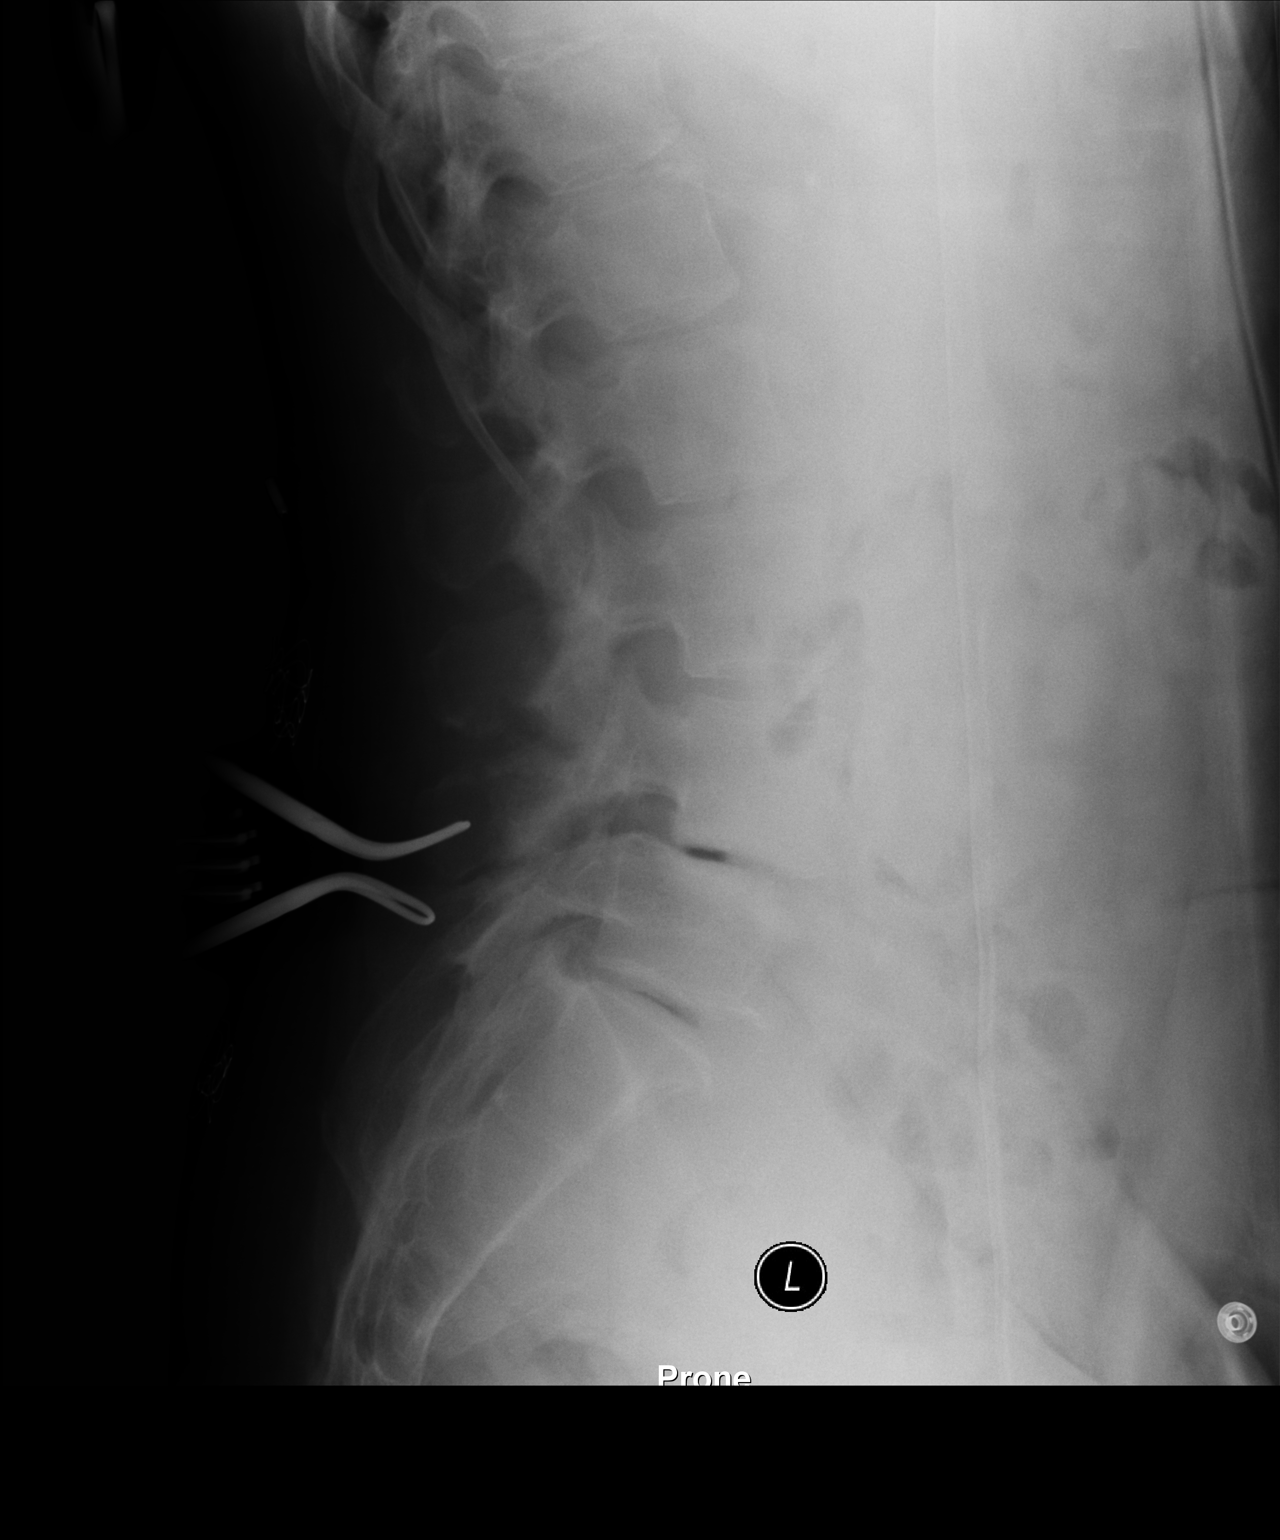

[1 of 1 positions shown; findings below may reference images not displayed]

FINDINGS: Single intraoperative lateral view of the lumbar spine submitted for
review after surgery.

Assuming that on the prior outside MR [place] fully open disc space is
labeled L5-S1 and 1.2 cm anterior slip occurred at the L4-5 level,
superior metallic probe overlies the L4 spinous process and inferior
metallic probe the superior margin of the L5 spinous process.
IMPRESSION: Localization L4 and L5 spinous process as noted above.
